# Patient Record
Sex: Male | Born: 1965 | Marital: Married | Smoking: Never smoker
Health system: Southern US, Community
[De-identification: ages and names within clinical notes are randomized; demographics above are authoritative.]

## PROBLEM LIST (undated history)

## (undated) DIAGNOSIS — K8001 Calculus of gallbladder with acute cholecystitis with obstruction: Principal | ICD-10-CM

## (undated) HISTORY — DX: Calculus of gallbladder with acute cholecystitis with obstruction: K80.01

---

## 2013-07-01 ENCOUNTER — Encounter (HOSPITAL_COMMUNITY): Payer: BC Managed Care – PPO | Admitting: Anesthesiology

## 2013-07-01 ENCOUNTER — Encounter (HOSPITAL_COMMUNITY)
Admission: EM | Disposition: A | Discharge status: Home / Self Care | Payer: Self-pay | Source: Home / Self Care | Attending: Emergency Medicine

## 2013-07-01 ENCOUNTER — Observation Stay (HOSPITAL_COMMUNITY): Payer: BC Managed Care – PPO

## 2013-07-01 ENCOUNTER — Observation Stay (HOSPITAL_COMMUNITY)
Admission: EM | Admit: 2013-07-01 | Discharge: 2013-07-02 | Disposition: A | Discharge status: Home / Self Care | Payer: BC Managed Care – PPO | Attending: Surgery | Admitting: Surgery

## 2013-07-01 ENCOUNTER — Emergency Department (HOSPITAL_COMMUNITY): Payer: BC Managed Care – PPO

## 2013-07-01 ENCOUNTER — Encounter (HOSPITAL_COMMUNITY): Payer: Self-pay | Admitting: Emergency Medicine

## 2013-07-01 ENCOUNTER — Observation Stay (HOSPITAL_COMMUNITY): Payer: BC Managed Care – PPO | Admitting: Anesthesiology

## 2013-07-01 DIAGNOSIS — K8000 Calculus of gallbladder with acute cholecystitis without obstruction: Principal | ICD-10-CM | POA: Insufficient documentation

## 2013-07-01 DIAGNOSIS — K819 Cholecystitis, unspecified: Secondary | ICD-10-CM

## 2013-07-01 DIAGNOSIS — K810 Acute cholecystitis: Secondary | ICD-10-CM

## 2013-07-01 DIAGNOSIS — K8020 Calculus of gallbladder without cholecystitis without obstruction: Secondary | ICD-10-CM

## 2013-07-01 DIAGNOSIS — K8001 Calculus of gallbladder with acute cholecystitis with obstruction: Secondary | ICD-10-CM | POA: Diagnosis present

## 2013-07-01 DIAGNOSIS — R112 Nausea with vomiting, unspecified: Secondary | ICD-10-CM

## 2013-07-01 DIAGNOSIS — R1011 Right upper quadrant pain: Secondary | ICD-10-CM

## 2013-07-01 HISTORY — PX: CHOLECYSTECTOMY: SHX55

## 2013-07-01 HISTORY — DX: Calculus of gallbladder with acute cholecystitis with obstruction: K80.01

## 2013-07-01 HISTORY — PX: LAPAROSCOPIC CHOLECYSTECTOMY: SUR755

## 2013-07-01 LAB — CBC WITH DIFFERENTIAL/PLATELET
Eosinophils Relative: 0 % (ref 0–5)
HCT: 44.2 % (ref 39.0–52.0)
Hemoglobin: 15.4 g/dL (ref 13.0–17.0)
Lymphocytes Relative: 8 % — ABNORMAL LOW (ref 12–46)
Lymphs Abs: 0.7 10*3/uL (ref 0.7–4.0)
MCV: 89.8 fL (ref 78.0–100.0)
Monocytes Absolute: 0.2 10*3/uL (ref 0.1–1.0)
Platelets: 163 10*3/uL (ref 150–400)
RBC: 4.92 MIL/uL (ref 4.22–5.81)
RDW: 12.5 % (ref 11.5–15.5)
WBC: 9.2 10*3/uL (ref 4.0–10.5)

## 2013-07-01 LAB — TROPONIN I: Troponin I: <0.3 ng/mL (ref <0.30)

## 2013-07-01 LAB — COMPREHENSIVE METABOLIC PANEL
ALT: 16 U/L (ref 0–53)
Alkaline Phosphatase: 52 U/L (ref 39–117)
CO2: 23 mEq/L (ref 19–32)
Calcium: 9.5 mg/dL (ref 8.4–10.5)
Creatinine, Ser: 0.77 mg/dL (ref 0.50–1.35)
GFR calc Af Amer: >90 mL/min (ref >90)
GFR calc non Af Amer: >90 mL/min (ref >90)
Glucose, Bld: 132 mg/dL — ABNORMAL HIGH (ref 70–99)
Potassium: 3.9 mEq/L (ref 3.5–5.1)
Sodium: 135 mEq/L (ref 135–145)

## 2013-07-01 SURGERY — LAPAROSCOPIC CHOLECYSTECTOMY WITH INTRAOPERATIVE CHOLANGIOGRAM
Anesthesia: General | Site: Abdomen

## 2013-07-01 MED ORDER — SODIUM CHLORIDE 0.9 % IV BOLUS (SEPSIS)
1000.0000 mL | Freq: Once | INTRAVENOUS | Status: AC
  Administered 2013-07-01: 1000 mL via INTRAVENOUS

## 2013-07-01 MED ORDER — HEPARIN SODIUM (PORCINE) 5000 UNIT/ML IJ SOLN
5000.0000 [IU] | Freq: Three times a day (TID) | INTRAMUSCULAR | Status: DC
  Filled 2013-07-01 (×3): qty 1

## 2013-07-01 MED ORDER — ARTIFICIAL TEARS OP OINT
TOPICAL_OINTMENT | OPHTHALMIC | Status: DC | PRN
  Administered 2013-07-01: 1 via OPHTHALMIC

## 2013-07-01 MED ORDER — ONDANSETRON HCL 4 MG/2ML IJ SOLN
INTRAMUSCULAR | Status: DC | PRN
  Administered 2013-07-01: 4 mg via INTRAVENOUS

## 2013-07-01 MED ORDER — PROMETHAZINE HCL 25 MG/ML IJ SOLN: 6.2500 mg | INTRAMUSCULAR | Status: DC | PRN

## 2013-07-01 MED ORDER — KCL IN DEXTROSE-NACL 20-5-0.45 MEQ/L-%-% IV SOLN
INTRAVENOUS | Status: DC
  Administered 2013-07-01: 22:00:00 via INTRAVENOUS
  Filled 2013-07-01 (×5): qty 1000

## 2013-07-01 MED ORDER — ONDANSETRON HCL 4 MG/2ML IJ SOLN
4.0000 mg | Freq: Once | INTRAMUSCULAR | Status: AC
  Administered 2013-07-01: 4 mg via INTRAVENOUS
  Filled 2013-07-01: qty 2

## 2013-07-01 MED ORDER — FENTANYL CITRATE 0.05 MG/ML IJ SOLN
INTRAMUSCULAR | Status: DC | PRN
  Administered 2013-07-01: 50 ug via INTRAVENOUS
  Administered 2013-07-01: 100 ug via INTRAVENOUS

## 2013-07-01 MED ORDER — HYDROMORPHONE HCL PF 1 MG/ML IJ SOLN
2.0000 mg | INTRAMUSCULAR | Status: DC | PRN
  Administered 2013-07-02: 2 mg via INTRAVENOUS
  Filled 2013-07-01: qty 2

## 2013-07-01 MED ORDER — BUPIVACAINE HCL (PF) 0.25 % IJ SOLN
INTRAMUSCULAR | Status: AC
  Filled 2013-07-01: qty 30

## 2013-07-01 MED ORDER — OXYCODONE-ACETAMINOPHEN 5-325 MG PO TABS
1.0000 | ORAL_TABLET | ORAL | Status: DC | PRN
Start: 2013-07-01 — End: 2013-07-02
  Administered 2013-07-02: 2 via ORAL
  Filled 2013-07-01: qty 2

## 2013-07-01 MED ORDER — LIDOCAINE HCL (CARDIAC) 20 MG/ML IV SOLN
INTRAVENOUS | Status: DC | PRN
  Administered 2013-07-01: 100 mg via INTRAVENOUS

## 2013-07-01 MED ORDER — LACTATED RINGERS IV SOLN
INTRAVENOUS | Status: DC | PRN
  Administered 2013-07-01 (×2): via INTRAVENOUS

## 2013-07-01 MED ORDER — CIPROFLOXACIN IN D5W 400 MG/200ML IV SOLN
400.0000 mg | Freq: Two times a day (BID) | INTRAVENOUS | Status: DC
  Administered 2013-07-01 (×2): 400 mg via INTRAVENOUS
  Filled 2013-07-01 (×3): qty 200

## 2013-07-01 MED ORDER — OXYCODONE HCL 5 MG/5ML PO SOLN: 5.0000 mg | Freq: Once | ORAL | Status: DC | PRN

## 2013-07-01 MED ORDER — SODIUM CHLORIDE 0.9 % IR SOLN
Status: DC | PRN
  Administered 2013-07-01: 1000 mL

## 2013-07-01 MED ORDER — HYDROMORPHONE HCL PF 1 MG/ML IJ SOLN
0.2500 mg | INTRAMUSCULAR | Status: DC | PRN
  Administered 2013-07-01 (×2): 0.5 mg via INTRAVENOUS

## 2013-07-01 MED ORDER — HYDROMORPHONE HCL PF 1 MG/ML IJ SOLN
INTRAMUSCULAR | Status: AC
  Filled 2013-07-01: qty 1

## 2013-07-01 MED ORDER — DIPHENHYDRAMINE HCL 50 MG/ML IJ SOLN: 12.5000 mg | Freq: Four times a day (QID) | INTRAMUSCULAR | Status: DC | PRN

## 2013-07-01 MED ORDER — SUCCINYLCHOLINE CHLORIDE 20 MG/ML IJ SOLN
INTRAMUSCULAR | Status: DC | PRN
  Administered 2013-07-01: 100 mg via INTRAVENOUS

## 2013-07-01 MED ORDER — GLYCOPYRROLATE 0.2 MG/ML IJ SOLN
INTRAMUSCULAR | Status: DC | PRN
  Administered 2013-07-01: 0.6 mg via INTRAVENOUS

## 2013-07-01 MED ORDER — ONDANSETRON HCL 4 MG/2ML IJ SOLN: 4.0000 mg | INTRAMUSCULAR | Status: DC | PRN

## 2013-07-01 MED ORDER — MORPHINE SULFATE 4 MG/ML IJ SOLN
4.0000 mg | Freq: Once | INTRAMUSCULAR | Status: AC
  Administered 2013-07-01: 4 mg via INTRAVENOUS
  Filled 2013-07-01: qty 1

## 2013-07-01 MED ORDER — DIPHENHYDRAMINE HCL 12.5 MG/5ML PO ELIX: 12.5000 mg | ORAL_SOLUTION | Freq: Four times a day (QID) | ORAL | Status: DC | PRN

## 2013-07-01 MED ORDER — KCL IN DEXTROSE-NACL 20-5-0.45 MEQ/L-%-% IV SOLN
INTRAVENOUS | Status: DC
  Administered 2013-07-01: 14:00:00 via INTRAVENOUS
  Filled 2013-07-01 (×3): qty 1000

## 2013-07-01 MED ORDER — MIDAZOLAM HCL 5 MG/5ML IJ SOLN
INTRAMUSCULAR | Status: DC | PRN
  Administered 2013-07-01: 2 mg via INTRAVENOUS

## 2013-07-01 MED ORDER — MORPHINE SULFATE 2 MG/ML IJ SOLN: 1.0000 mg | INTRAMUSCULAR | Status: DC | PRN

## 2013-07-01 MED ORDER — OXYCODONE HCL 5 MG PO TABS: 5.0000 mg | ORAL_TABLET | Freq: Once | ORAL | Status: DC | PRN

## 2013-07-01 MED ORDER — BUPIVACAINE HCL (PF) 0.25 % IJ SOLN
INTRAMUSCULAR | Status: DC | PRN
  Administered 2013-07-01: 25 mL

## 2013-07-01 MED ORDER — NEOSTIGMINE METHYLSULFATE 1 MG/ML IJ SOLN
INTRAMUSCULAR | Status: DC | PRN
  Administered 2013-07-01: 4 mg via INTRAVENOUS

## 2013-07-01 MED ORDER — PROPOFOL 10 MG/ML IV BOLUS
INTRAVENOUS | Status: DC | PRN
  Administered 2013-07-01: 200 mg via INTRAVENOUS

## 2013-07-01 MED ORDER — ROCURONIUM BROMIDE 100 MG/10ML IV SOLN
INTRAVENOUS | Status: DC | PRN
  Administered 2013-07-01: 30 mg via INTRAVENOUS

## 2013-07-01 MED ORDER — SODIUM CHLORIDE 0.9 % IV SOLN
INTRAVENOUS | Status: DC | PRN
  Administered 2013-07-01: 17:00:00

## 2013-07-01 SURGICAL SUPPLY — 42 items
ADH SKN CLS APL DERMABOND .7 (GAUZE/BANDAGES/DRESSINGS) ×1
APPLIER CLIP ROT 10 11.4 M/L (STAPLE) ×2
APR CLP MED LRG 11.4X10 (STAPLE) ×1
BAG SPEC RTRVL LRG 6X4 10 (ENDOMECHANICALS) ×1
BLADE SURG ROTATE 9660 (MISCELLANEOUS) IMPLANT
CANISTER SUCTION 2500CC (MISCELLANEOUS) ×2 IMPLANT
CHLORAPREP W/TINT 26ML (MISCELLANEOUS) ×2 IMPLANT
CLIP APPLIE ROT 10 11.4 M/L (STAPLE) ×1 IMPLANT
CLSR STERI-STRIP ANTIMIC 1/2X4 (GAUZE/BANDAGES/DRESSINGS) ×2 IMPLANT
COVER MAYO STAND STRL (DRAPES) ×2 IMPLANT
COVER SURGICAL LIGHT HANDLE (MISCELLANEOUS) ×2 IMPLANT
DECANTER SPIKE VIAL GLASS SM (MISCELLANEOUS) ×2 IMPLANT
DERMABOND ADVANCED (GAUZE/BANDAGES/DRESSINGS) ×1
DERMABOND ADVANCED .7 DNX12 (GAUZE/BANDAGES/DRESSINGS) ×1 IMPLANT
DRAPE C-ARM 42X72 X-RAY (DRAPES) ×2 IMPLANT
DRAPE UTILITY 15X26 W/TAPE STR (DRAPE) ×4 IMPLANT
ELECT REM PT RETURN 9FT ADLT (ELECTROSURGICAL) ×2
ELECTRODE REM PT RTRN 9FT ADLT (ELECTROSURGICAL) ×1 IMPLANT
FILTER SMOKE EVAC LAPAROSHD (FILTER) IMPLANT
GLOVE BIOGEL PI IND STRL 6.5 (GLOVE) ×1 IMPLANT
GLOVE BIOGEL PI INDICATOR 6.5 (GLOVE) ×1
GLOVE EUDERMIC 7 POWDERFREE (GLOVE) ×2 IMPLANT
GLOVE SURG SS PI 6.0 STRL IVOR (GLOVE) ×1 IMPLANT
GOWN STRL NON-REIN LRG LVL3 (GOWN DISPOSABLE) ×2 IMPLANT
GOWN STRL REIN XL XLG (GOWN DISPOSABLE) ×2 IMPLANT
KIT BASIN OR (CUSTOM PROCEDURE TRAY) ×2 IMPLANT
KIT ROOM TURNOVER OR (KITS) ×2 IMPLANT
NS IRRIG 1000ML POUR BTL (IV SOLUTION) ×2 IMPLANT
PAD ARMBOARD 7.5X6 YLW CONV (MISCELLANEOUS) ×2 IMPLANT
POUCH SPECIMEN RETRIEVAL 10MM (ENDOMECHANICALS) ×2 IMPLANT
SCISSORS LAP 5X35 DISP (ENDOMECHANICALS) ×2 IMPLANT
SET CHOLANGIOGRAPH 5 50 .035 (SET/KITS/TRAYS/PACK) ×2 IMPLANT
SET IRRIG TUBING LAPAROSCOPIC (IRRIGATION / IRRIGATOR) ×2 IMPLANT
SLEEVE ENDOPATH XCEL 5M (ENDOMECHANICALS) ×2 IMPLANT
SPECIMEN JAR SMALL (MISCELLANEOUS) ×2 IMPLANT
SUT MNCRL AB 4-0 PS2 18 (SUTURE) ×2 IMPLANT
TOWEL OR 17X24 6PK STRL BLUE (TOWEL DISPOSABLE) ×2 IMPLANT
TOWEL OR 17X26 10 PK STRL BLUE (TOWEL DISPOSABLE) ×2 IMPLANT
TRAY LAPAROSCOPIC (CUSTOM PROCEDURE TRAY) ×2 IMPLANT
TROCAR XCEL BLUNT TIP 100MML (ENDOMECHANICALS) ×2 IMPLANT
TROCAR XCEL NON-BLD 11X100MML (ENDOMECHANICALS) ×2 IMPLANT
TROCAR XCEL NON-BLD 5MMX100MML (ENDOMECHANICALS) ×2 IMPLANT

## 2013-07-01 NOTE — Anesthesia Procedure Notes (Signed)
Procedure Name: Intubation Date/Time: 07/01/2013 5:04 PM Performed by: Angelica Pou Pre-anesthesia Checklist: Patient identified, Patient being monitored, Emergency Drugs available, Timeout performed and Suction available Patient Re-evaluated:Patient Re-evaluated prior to inductionOxygen Delivery Method: Circle system utilized Preoxygenation: Pre-oxygenation with 100% oxygen Intubation Type: IV induction, Rapid sequence and Cricoid Pressure applied Laryngoscope Size: Mac and 3 Grade View: Grade I Tube type: Oral Tube size: 7.5 mm Number of attempts: 1 Airway Equipment and Method: Stylet Placement Confirmation: ETT inserted through vocal cords under direct vision,  breath sounds checked- equal and bilateral and positive ETCO2 Secured at: 22 cm Tube secured with: Tape Dental Injury: Teeth and Oropharynx as per pre-operative assessment  Comments: Smooth IV induction by Dr Krista Blue; easy atraumatic RSI by Pasty Arch CRNA.

## 2013-07-01 NOTE — ED Notes (Addendum)
Reports pain comes on after eating fatty, greasy foods past 3 weeks. Pain lasts approx an hour & would go away but last night has been constant after eating steak. States pain radiates through to back & sometimes up towards shoulder blade

## 2013-07-01 NOTE — ED Notes (Signed)
Patient transported to Ultrasound 

## 2013-07-01 NOTE — Anesthesia Preprocedure Evaluation (Addendum)
Anesthesia Evaluation  Patient identified by MRN, date of birth, ID band Patient awake    Reviewed: Allergy & Precautions, H&P , NPO status , Patient's Chart, lab work & pertinent test results  History of Anesthesia Complications Negative for: history of anesthetic complications  Airway Mallampati: I TM Distance: >3 FB Neck ROM: Full    Dental  (+) Teeth Intact and Dental Advisory Given   Pulmonary neg pulmonary ROS,  breath sounds clear to auscultation        Cardiovascular negative cardio ROS  Rhythm:regular Rate:Normal     Neuro/Psych negative neurological ROS  negative psych ROS   GI/Hepatic Neg liver ROS,   Endo/Other  negative endocrine ROS  Renal/GU negative Renal ROS     Musculoskeletal   Abdominal   Peds  Hematology   Anesthesia Other Findings   Reproductive/Obstetrics negative OB ROS                          Anesthesia Physical Anesthesia Plan  ASA: II and emergent  Anesthesia Plan: General ETT and General   Post-op Pain Management:    Induction: Intravenous  Airway Management Planned: Oral ETT  Additional Equipment:   Intra-op Plan:   Post-operative Plan: Extubation in OR  Informed Consent: I have reviewed the patients History and Physical, chart, labs and discussed the procedure including the risks, benefits and alternatives for the proposed anesthesia with the patient or authorized representative who has indicated his/her understanding and acceptance.   Dental advisory given  Plan Discussed with: Anesthesiologist, CRNA and Surgeon  Anesthesia Plan Comments:        Anesthesia Quick Evaluation

## 2013-07-01 NOTE — Anesthesia Postprocedure Evaluation (Signed)
Anesthesia Post Note  Patient: George Patrick  Procedure(s) Performed: Procedure(s) (LRB): LAPAROSCOPIC CHOLECYSTECTOMY WITH INTRAOPERATIVE CHOLANGIOGRAM (N/A)  Anesthesia type: General  Patient location: PACU  Post pain: Pain level controlled and Adequate analgesia  Post assessment: Post-op Vital signs reviewed, Patient's Cardiovascular Status Stable, Respiratory Function Stable, Patent Airway and Pain level controlled  Last Vitals:  Filed Vitals:   07/01/13 1936  BP: 111/65  Pulse: 58  Temp: 36.4 C  Resp: 15    Post vital signs: Reviewed and stable  Level of consciousness: awake, alert  and oriented  Complications: No apparent anesthesia complications

## 2013-07-01 NOTE — Preoperative (Signed)
Beta Blockers   Reason not to administer Beta Blockers:Not Applicable 

## 2013-07-01 NOTE — H&P (Signed)
George Patrick 09/25/1965  [id number].   Primary Care MD: none Chief Complaint/Reason for Consult: RUQ abdominal pain HPI: This is [name] who for the last 3 weeks has had intermittent RUQ abdominal pain after eating fatty or spicy food.  It would last for 2-3 hours and go away.  Last night around 2000, he developed sharp RUQ abdominal pain with profuse nausea and emesis.  He denies any fevers or chills.  Due to persistent abdominal pain, he presented to the [facility] where he had a work up that revealed gallstones, but no definite gb wall thickening.  He did have a + Murphy's signs though on ultrasound.  His labs are all normal except he has a left shift with a neutrophil count of 90.  We have been asked to evaluate the patient for admission.  ROS: Please seeHPI, otherwise all other systems have been reviewed and are negative  No family history on file.  History reviewed. No pertinent past medical history.  History reviewed. No pertinent past surgical history.  Social History:  reports that he has never smoked. He does not have any smokeless tobacco history on file. He reports that he does not drink alcohol or use illicit drugs.  Allergies: No Known Allergies   (Not in a hospital admission)  Blood pressure 134/69, pulse 55, temperature 99 F (37.2 C), temperature source Oral, resp. rate 16, height 5\' 10"  (1.778 m), weight 170 lb (77.111 kg), SpO2 100.00%. Physical Exam: General: pleasant, WD, WN white male who is laying in bed in NAD HEENT: head is normocephalic, atraumatic.  Sclera are noninjected.  PERRL.  Ears and nose without any masses or lesions.  Mouth is pink and moist Heart: regular, rate, and rhythm.  Normal s1,s2. No obvious murmurs, gallops, or rubs noted.  Palpable radial and pedal pulses bilaterally Lungs: CTAB, no wheezes, rhonchi, or rales noted.  Respiratory effort nonlabored Abd: soft, mild tenderness in RUQ (just received pain meds), ND, +BS, no masses,  hernias, or organomegaly MS: all 4 extremities are symmetrical with no cyanosis, clubbing, or edema. Skin: warm and dry with no masses, lesions, or rashes Psych: A&Ox3 with an appropriate affect.    Results for orders placed during the hospital encounter of 07/01/13 (from the past 48 hour(s))  CBC WITH DIFFERENTIAL     Status: Abnormal   Collection Time    07/01/13  8:30 AM      Result Value Range   WBC 9.2  4.0 - 10.5 K/uL   RBC 4.92  4.22 - 5.81 MIL/uL   Hemoglobin 15.4  13.0 - 17.0 g/dL   HCT 40.9  81.1 - 91.4 %   MCV 89.8  78.0 - 100.0 fL   MCH 31.3  26.0 - 34.0 pg   MCHC 34.8  30.0 - 36.0 g/dL   RDW 78.2  95.6 - 21.3 %   Platelets 163  150 - 400 K/uL   Neutrophils Relative % 90 (*) 43 - 77 %   Neutro Abs 8.2 (*) 1.7 - 7.7 K/uL   Lymphocytes Relative 8 (*) 12 - 46 %   Lymphs Abs 0.7  0.7 - 4.0 K/uL   Monocytes Relative 3  3 - 12 %   Monocytes Absolute 0.2  0.1 - 1.0 K/uL   Eosinophils Relative 0  0 - 5 %   Eosinophils Absolute 0.0  0.0 - 0.7 K/uL   Basophils Relative 0  0 - 1 %   Basophils Absolute 0.0  0.0 -  0.1 K/uL  COMPREHENSIVE METABOLIC PANEL     Status: Abnormal   Collection Time    07/01/13  8:30 AM      Result Value Range   Sodium 135  135 - 145 mEq/L   Potassium 3.9  3.5 - 5.1 mEq/L   Chloride 99  96 - 112 mEq/L   CO2 23  19 - 32 mEq/L   Glucose, Bld 132 (*) 70 - 99 mg/dL   BUN 21  6 - 23 mg/dL   Creatinine, Ser 4.09  0.50 - 1.35 mg/dL   Calcium 9.5  8.4 - 81.1 mg/dL   Total Protein 7.8  6.0 - 8.3 g/dL   Albumin 4.5  3.5 - 5.2 g/dL   AST 18  0 - 37 U/L   ALT 16  0 - 53 U/L   Alkaline Phosphatase 52  39 - 117 U/L   Total Bilirubin 0.5  0.3 - 1.2 mg/dL   GFR calc non Af Amer >90  >90 mL/min   GFR calc Af Amer >90  >90 mL/min   Comment: (NOTE)     The eGFR has been calculated using the CKD EPI equation.     This calculation has not been validated in all clinical situations.     eGFR's persistently <90 mL/min signify possible Chronic Kidney      Disease.  LIPASE, BLOOD     Status: None   Collection Time    07/01/13  8:30 AM      Result Value Range   Lipase 28  11 - 59 U/L  TROPONIN I     Status: None   Collection Time    07/01/13  8:30 AM      Result Value Range   Troponin I <0.30  <0.30 ng/mL   Comment:            Due to the release kinetics of cTnI,     a negative result within the first hours     of the onset of symptoms does not rule out     myocardial infarction with certainty.     If myocardial infarction is still suspected,     repeat the test at appropriate intervals.   US Abdomen Complete  07/01/2013   CLINICAL DATA:  47 year old male with nausea vomiting and right upper quadrant pain. Initial encounter.  EXAM: ULTRASOUND ABDOMEN COMPLETE  COMPARISON:  CT Abdomen and Pelvis 01/14/2006 [facility] radiology.  FINDINGS: Gallbladder:  Positive gallstones, up to 18 mm diameter individually. Gallbladder wall thickness is at 2 mm, but a positive sonographic Eulah Pont sign is elicited. No pericholecystic fluid.  Common bile duct:  Diameter: 6-7 mm, upper limits of normal to mildly increased.  Liver:  No definite intrahepatic ductal dilatation. No focal lesion identified. Within normal limits in parenchymal echogenicity.  IVC:  No abnormality visualized.  Pancreas:  Visualized portion unremarkable.  Spleen:  Size and appearance within normal limits.  Right Kidney:  Length: 11.3 cm. Echogenicity within normal limits. No mass or hydronephrosis visualized.  Left Kidney:  Length: 12.1 cm. Echogenicity within normal limits. No mass or hydronephrosis visualized.  Abdominal aorta:  No aneurysm visualized.  Other findings:  None.  IMPRESSION: 1. Positive for cholelithiasis and positive sonographic Murphy's sign, compatible with acute cholecystitis.  2. CBD at the upper limits of normal to mildly increased. No definite intrahepatic ductal dilatation.   Electronically Signed   By: Augusto Gamble M.D.   On: 07/01/2013 09:42  Assessment/Plan 1.  Cholelithiasis with probably early cholecystitis  Plan: 1. We will get the patient admitted and procedure with surgical intervention today for a cholecystectomy.  He will be started on IV abx therapy as well as multiple meds for pain and nausea.  I have explained this plan to the patient and his family.  They understand and are agreeable to proceed with surgical intervention.  Sharmel Ballantine E 07/01/2013, 11:08 AM Pager: [phone]

## 2013-07-01 NOTE — Transfer of Care (Signed)
Immediate Anesthesia Transfer of Care Note  Patient: George Patrick  Procedure(s) Performed: Procedure(s): LAPAROSCOPIC CHOLECYSTECTOMY WITH INTRAOPERATIVE CHOLANGIOGRAM (N/A)  Patient Location: PACU  Anesthesia Type:General  Level of Consciousness: awake, oriented and patient cooperative  Airway & Oxygen Therapy: Patient Spontanous Breathing and Patient connected to nasal cannula oxygen  Post-op Assessment: Report given to PACU RN, Post -op Vital signs reviewed and stable and Patient moving all extremities  Post vital signs: Reviewed and stable  Complications: No apparent anesthesia complications

## 2013-07-01 NOTE — H&P (Signed)
Agree with A&P of KO,PA. I discussed surgery risks etc and he is ready to proceed.

## 2013-07-01 NOTE — ED Provider Notes (Signed)
Medical screening examination/treatment/procedure(s) were conducted as a shared visit with non-physician practitioner(s) and myself.  I personally evaluated the patient during the encounter.  EKG Interpretation    Date/Time:    Ventricular Rate:    PR Interval:    QRS Duration:   QT Interval:    QTC Calculation:   R Axis:     Text Interpretation:              Acute cholecystitis.  Admit to Gen Surgery.  VSS.  No issue in ER.    Darlys Gales, MD 07/01/13 [phone]

## 2013-07-01 NOTE — ED Provider Notes (Signed)
CSN: [id number]     Arrival date & time 07/01/13  4098 History   First MD Initiated Contact with Patient 07/01/13 0756     Chief Complaint  Patient presents with  . Abdominal Pain   (Consider location/radiation/quality/duration/timing/severity/associated sxs/prior Treatment) The history is provided by the patient and medical records.   This is a 47 year old male with no significant past medical history presenting to the ED for intermittent right upper quadrant pain for the past 3 weeks. Pain has been constant since 2000 last night, described as a throbbing pain with intermittent sharp, stabbing sensations with some radiation to his back.  States there is occasional radiation to the right side of his chest, none in the past 24 hours.  Pain associated with nausea and approximately 12 episodes of nonbloody, nonbilious emesis. Last emesis at 0400 this morning. Last PO intake was a steak last night.  No prior abdominal surgeries.  Not on any daily medications.  Denies any current chest pain, SOB, palpitations, dizziness, weakness.  No past medical history on file. No past surgical history on file. No family history on file. History  Substance Use Topics  . Smoking status: Not on file  . Smokeless tobacco: Not on file  . Alcohol Use: Not on file    Review of Systems  Gastrointestinal: Positive for nausea, vomiting and abdominal pain.  All other systems reviewed and are negative.    Allergies  Review of patient's allergies indicates not on file.  Home Medications  No current outpatient prescriptions on file. BP 141/76  Pulse 65  Temp(Src) 97.9 F (36.6 C) (Oral)  Resp 16  Ht 5\' 10"  (1.778 m)  Wt 170 lb (77.111 kg)  BMI 24.39 kg/m2  SpO2 100%  Physical Exam  Nursing note and vitals reviewed. Constitutional: He is oriented to person, place, and time. He appears well-developed and well-nourished.  HENT:  Head: Normocephalic and atraumatic.  Mouth/Throat: Oropharynx is clear and  moist.  Eyes: Conjunctivae and EOM are normal. Pupils are equal, round, and reactive to light.  Neck: Normal range of motion.  Cardiovascular: Normal rate, regular rhythm and normal heart sounds.   Pulmonary/Chest: Effort normal and breath sounds normal. No respiratory distress. He has no wheezes.  Abdominal: Soft. Bowel sounds are normal. There is tenderness in the right upper quadrant. There is positive Murphy's sign. There is no rigidity, no guarding, no CVA tenderness and no tenderness at McBurney's point.  Abdomen soft, non-distended, TTP RUQ, + Murphy's  Musculoskeletal: Normal range of motion.  Neurological: He is alert and oriented to person, place, and time.  Skin: Skin is warm and dry.  Psychiatric: He has a normal mood and affect.    ED Course  Procedures (including critical care time) Labs Review Labs Reviewed  CBC WITH DIFFERENTIAL - Abnormal; Notable for the following:    Neutrophils Relative % 90 (*)    Neutro Abs 8.2 (*)    Lymphocytes Relative 8 (*)    All other components within normal limits  COMPREHENSIVE METABOLIC PANEL - Abnormal; Notable for the following:    Glucose, Bld 132 (*)    All other components within normal limits  LIPASE, BLOOD  TROPONIN I   Imaging Review US Abdomen Complete  07/01/2013   CLINICAL DATA:  47 year old male with nausea vomiting and right upper quadrant pain. Initial encounter.  EXAM: ULTRASOUND ABDOMEN COMPLETE  COMPARISON:  CT Abdomen and Pelvis 01/14/2006 [facility] radiology.  FINDINGS: Gallbladder:  Positive gallstones, up to 18 mm diameter individually.  Gallbladder wall thickness is at 2 mm, but a positive sonographic Eulah Pont sign is elicited. No pericholecystic fluid.  Common bile duct:  Diameter: 6-7 mm, upper limits of normal to mildly increased.  Liver:  No definite intrahepatic ductal dilatation. No focal lesion identified. Within normal limits in parenchymal echogenicity.  IVC:  No abnormality visualized.  Pancreas:   Visualized portion unremarkable.  Spleen:  Size and appearance within normal limits.  Right Kidney:  Length: 11.3 cm. Echogenicity within normal limits. No mass or hydronephrosis visualized.  Left Kidney:  Length: 12.1 cm. Echogenicity within normal limits. No mass or hydronephrosis visualized.  Abdominal aorta:  No aneurysm visualized.  Other findings:  None.  IMPRESSION: 1. Positive for cholelithiasis and positive sonographic Murphy's sign, compatible with acute cholecystitis.  2. CBD at the upper limits of normal to mildly increased. No definite intrahepatic ductal dilatation.   Electronically Signed   By: Augusto Gamble M.D.   On: 07/01/2013 09:42    EKG Interpretation   None       MDM   1. Acute cholecystitis   2. Nausea & vomiting     Labs as above, LFT's and bili WNL, no leukocytosis.  U/s consistent with acute cholecystitis.  Consulted general surgery, spoke with Tresa Endo (surgical PA) will see pt in the ED and admit.  Likely surgery this afternoon.  VS stable at this time.   Garlon Hatchet, PA-C 07/01/13 1150

## 2013-07-01 NOTE — ED Notes (Signed)
Jettie Pagan, PA with CCS at bedside.

## 2013-07-01 NOTE — ED Notes (Signed)
C/o intermittent RUQ pain x 3 weeks, pain has been constant since 2000 last night with n/v

## 2013-07-01 NOTE — Op Note (Signed)
Almalik L Mau 30-Jul-1965 [id number] 07/01/2013  Preoperative diagnosis: Acute calculus cholecystitis  Postoperative diagnosis: Same  Procedure: Laparoscopic cholecystectomy and intraoperative plan TRAM  Surgeon: Currie Paris, MD, FACS    Anesthesia: General  Clinical History and Indications: The patient presented to the emergency room today with acute right upper quadrant pain and was found to have right upper quadrant tenderness and a tender gallbladder with stones. After discussion he elected to proceed today to cholecystectomy.Marland Kitchen  Description of procedure: The patient was seen in the preoperative area. I reviewed the plans for the procedure with him as well as the risks and complications. He had no further questions and wished to proceed.  The patient was taken to the operating room. After satisfactory general endotracheal anesthesia had been obtained the abdomen was prepped and draped. A time out was done.  0.25% plain Marcaine was used at all incisions. I made an umbilical incision, identified the fascia and opened that, and entered the peritoneal cavity under direct vision. A 0 Vicryl pursestring suture was placed and the Hasson cannula was introduced under direct vision and secured with the pursestring. The abdomen was inflated to 15 cm.  The camera was placed and there were no gross abnormalities. The patient was then placed in reverse Trendelenburg and tilted to the left. A 10/11 trocar was placed in the epigastrium and two 5 mm trochars placed laterally all under direct vision.  The gallbladder was tense distended and inflamed and edematous. It was decompressed a large needle. I was able to grasp and retracted over the liver an open edematous peritoneum around the cystic duct. There is a small artery anterior to the duct which was clipped. A long segment of the duct was isolated. A more posterior branch of the artery was also noted. A clip was placed on the duct.  An  intraoperative cholangiogram was then performed. A Cook catheter was introduced percutaneously and placed in the cystic duct. The cholangiogram showed good filling of the common duct and hepatic radicals and free flow into the duodenum. No abnormalities were noted.  The catheter was removed and 3 clips placed on the stay side of the cystic duct. The duct was then divided.  Additional clips are placed on the cystic artery and it was divided. The gallbladder was then removed from below to above the coagulation current of the cautery. It was then placed in a bag to be retrieved later.  The abdomen was irrigated and a check for hemostasis along the bed of the gallbladder made. Once everything appeared to be dry we were able to move the camera to the epigastric port and removed the gallbladder through the umbilical port.  The abdomen was reinsufflated and a final check for hemostasis made. There is no evidence of bleeding or bile leakage. The lateral ports were removed under direct vision and there was no bleeding. The umbilical site was closed with a pursestring, watching with the camera in the epigastric port. The abdomen was then deflated through the epigastric port and that was removed. Skin was closed with 4-0 Monocryl subcuticular and Dermabond.  The patient tolerated the procedure well. There were no operative complications. EBL was minimal. All counts were correct.  Currie Paris, MD, FACS 07/01/2013 6:13 PM

## 2013-07-02 MED ORDER — OXYCODONE-ACETAMINOPHEN 5-325 MG PO TABS: 1.0000 | ORAL_TABLET | ORAL | Status: DC | PRN

## 2013-07-02 MED ORDER — WHITE PETROLATUM GEL
Status: AC
  Administered 2013-07-02: 11:00:00
  Filled 2013-07-02: qty 5

## 2013-07-02 NOTE — Progress Notes (Signed)
1 Day Post-Op  Subjective: Tolerating diet.  Feels better than preop. Pain controlled  Objective: Vital signs in last 24 hours: Temp:  [97.3 F (36.3 C)-98.2 F (36.8 C)] 98.2 F (36.8 C) (12/06 0954) Pulse Rate:  [51-73] 61 (12/06 0954) Resp:  [10-22] 16 (12/06 0954) BP: (102-146)/(44-76) 138/76 mmHg (12/06 0954) SpO2:  [97 %-100 %] 98 % (12/06 0954) Last BM Date: 06/30/13  Intake/Output from previous day: 12/05 0701 - 12/06 0700 In: 3656.7 [P.O.:220; I.V.:3036.7; IV Piggyback:400] Out: -  Intake/Output this shift:    General appearance: alert, cooperative and no distress Resp: clear to auscultation bilaterally Cardio: normal rate GI: soft, appropriate incisional tenderness, ND, wounds okay  Lab Results:   Recent Labs  07/01/13 0830  WBC 9.2  HGB 15.4  HCT 44.2  PLT 163   BMET  Recent Labs  07/01/13 0830  NA 135  K 3.9  CL 99  CO2 23  GLUCOSE 132*  BUN 21  CREATININE 0.77  CALCIUM 9.5   PT/INR No results found for this basename: LABPROT, INR,  in the last 72 hours ABG No results found for this basename: PHART, PCO2, PO2, HCO3,  in the last 72 hours  Studies/Results: Dg Cholangiogram Operative  07/01/2013   CLINICAL DATA:  Cholecystectomy for cholecystitis and cholelithiasis.  EXAM: INTRAOPERATIVE CHOLANGIOGRAM  TECHNIQUE: Cholangiographic images from the C-arm fluoroscopic device were submitted for interpretation post-operatively. Please see the procedural report for the amount of contrast and the fluoroscopy time utilized.  COMPARISON:  Abdomen ultrasound obtained earlier today.  FINDINGS: Normal-appearing intrahepatic and extrahepatic bile ducts and cystic duct remnant. No intraductal filling defects. Free flow of contrast into the duodenum. Cholecystectomy clips.  IMPRESSION: Normal examination.  No retained ductal stones seen.   Electronically Signed   By: Gordan Payment M.D.   On: 07/01/2013 20:04   US Abdomen Complete  07/01/2013   CLINICAL DATA:   47 year old male with nausea vomiting and right upper quadrant pain. Initial encounter.  EXAM: ULTRASOUND ABDOMEN COMPLETE  COMPARISON:  CT Abdomen and Pelvis 01/14/2006 [facility] radiology.  FINDINGS: Gallbladder:  Positive gallstones, up to 18 mm diameter individually. Gallbladder wall thickness is at 2 mm, but a positive sonographic Eulah Pont sign is elicited. No pericholecystic fluid.  Common bile duct:  Diameter: 6-7 mm, upper limits of normal to mildly increased.  Liver:  No definite intrahepatic ductal dilatation. No focal lesion identified. Within normal limits in parenchymal echogenicity.  IVC:  No abnormality visualized.  Pancreas:  Visualized portion unremarkable.  Spleen:  Size and appearance within normal limits.  Right Kidney:  Length: 11.3 cm. Echogenicity within normal limits. No mass or hydronephrosis visualized.  Left Kidney:  Length: 12.1 cm. Echogenicity within normal limits. No mass or hydronephrosis visualized.  Abdominal aorta:  No aneurysm visualized.  Other findings:  None.  IMPRESSION: 1. Positive for cholelithiasis and positive sonographic Murphy's sign, compatible with acute cholecystitis.  2. CBD at the upper limits of normal to mildly increased. No definite intrahepatic ductal dilatation.   Electronically Signed   By: Augusto Gamble M.D.   On: 07/01/2013 09:42    Anti-infectives: Anti-infectives   Start     Dose/Rate Route Frequency Ordered Stop   07/01/13 1130  ciprofloxacin (CIPRO) IVPB 400 mg     400 mg 200 mL/hr over 60 Minutes Intravenous Every 12 hours 07/01/13 1120        Assessment/Plan: s/p Procedure(s): LAPAROSCOPIC CHOLECYSTECTOMY WITH INTRAOPERATIVE CHOLANGIOGRAM (N/A) He looks good.  tolerated breakfast.  feels better  than preop.  He should be okay for discharge to home.  discharge instructions reviewed with the patient  LOS: 1 day    Lodema Pilot DAVID 07/02/2013

## 2013-07-02 NOTE — Progress Notes (Signed)
Utilization Review completed.  

## 2013-07-02 NOTE — Progress Notes (Signed)
Discharge instructions gone over with patient. presciuption given. Home medications gone over. Incisional care, diet, activity, and signs and symptoms of infection gone over. Patient verbalized understanding of instructions. Follow up appointment to be made.

## 2013-07-05 ENCOUNTER — Encounter (HOSPITAL_COMMUNITY): Payer: Self-pay | Admitting: Surgery

## 2013-07-15 ENCOUNTER — Encounter (INDEPENDENT_AMBULATORY_CARE_PROVIDER_SITE_OTHER): Payer: Self-pay | Admitting: Surgery

## 2013-07-15 ENCOUNTER — Ambulatory Visit (INDEPENDENT_AMBULATORY_CARE_PROVIDER_SITE_OTHER): Payer: PRIVATE HEALTH INSURANCE | Admitting: Surgery

## 2013-07-15 VITALS — BP 110/70 | HR 60 | Temp 98.7°F | Resp 14 | Ht 70.0 in | Wt 169.2 lb

## 2013-07-15 DIAGNOSIS — K8001 Calculus of gallbladder with acute cholecystitis with obstruction: Secondary | ICD-10-CM

## 2013-07-15 NOTE — Progress Notes (Signed)
NAME: George Patrick       DOB: 26-Apr-1966           DATE: 07/15/2013       ZOX:[id number]   CC:  Chief Complaint  Patient presents with  . Routine Post Op    1st p/o lap chole     Impression:  The patient appears to be doing well, with improvement in his symptoms.  Plan:  He may resume full activity and regular diet. He  will followup with [place] on a p.r.n. basis. I did tell him that he may still have some foods that cause indigestion and ask him to call us if there are any questions, problems or concerns.  HPI:  This patient underwent a laparoscopic cholecystectomy with operative cholangiogram on 07/01/13. He is in for his first postoperative visit. He notes that his incisional pain has resolved. His preoperative symptoms have improved. He is not having problems with nausea, vomiting, diarrhea, fevers, chills, or urinary symptoms. He is tolerating diet. He feels that he is progressing well and nearly back to normal. PE:  VS: BP 110/70  Pulse 60  Temp(Src) 98.7 F (37.1 C) (Temporal)  Resp 14  Ht 5\' 10"  (1.778 m)  Wt 169 lb 3.2 oz (76.749 kg)  BMI 24.28 kg/m2  General: The patient is alert and appears comfortable, NAD.  Abdomen: Soft and benign. The incisions are healing nicely. There are no apparent problems.  Data reviewed: IOC:  FINDINGS:  Normal-appearing intrahepatic and extrahepatic bile ducts and cystic  duct remnant. No intraductal filling defects. Free flow of contrast  into the duodenum. Cholecystectomy clips.  IMPRESSION:  Normal examination. No retained ductal stones seen.  Electronically Signed  By: Gordan Payment M.D.  On: 07/01/2013 20:04  Pathology:  Diagnosis Gallbladder - ACUTE AND CHRONIC CHOLECYSTITIS. - CHOLELITHIASIS. [name] MD Pathologist, Electronic Signature (Case signed 07/04/2013)

## 2013-07-15 NOTE — Patient Instructions (Signed)
We will see you again on an as needed basis. Please call the office at [phone] if you have any questions or concerns. Thank you for allowing us to take care of you.  

## 2013-07-27 NOTE — Discharge Summary (Signed)
Patient ID: EMRE STOCK MRN: [id number] DOB/AGE: [birth year] 47 y.o.  Admit date: 07/01/2013 Discharge date: 07/27/2013  Procedures: lap chole with IOC  Consults: None  Reason for Admission: This is [name] who for the last 3 weeks has had intermittent RUQ abdominal pain after eating fatty or spicy food. It would last for 2-3 hours and go away. Last night around 2000, he developed sharp RUQ abdominal pain with profuse nausea and emesis. He denies any fevers or chills. Due to persistent abdominal pain, he presented to the [facility] where he had a work up that revealed gallstones, but no definite gb wall thickening. He did have a + Murphy's signs though on ultrasound. His labs are all normal except he has a left shift with a neutrophil count of 90. We have been asked to evaluate the patient for admission.  Admission Diagnoses:  1. Cholelithiasis with probably early cholecystitis  Hospital Course:  The patient was admitted and taken to the OR where he underwent a lap chole.  He tolerated this well and by POD1 was taking a regular diet and his pain was well controlled.  He was stable for dc home.  Discharge Diagnoses:  1. Acute cholecystitis, s/p lap chole  Discharge Medications:   Medication List    STOP taking these medications       ibuprofen 200 MG tablet  Commonly known as:  ADVIL,MOTRIN        Discharge Instructions: f/u with Dr. Jamey Ripa  Signed: Barnetta Chapel E 07/27/2013, 11:48 AM

## 2013-08-16 ENCOUNTER — Encounter (HOSPITAL_COMMUNITY): Payer: Self-pay | Admitting: Surgery

## 2013-08-16 NOTE — OR Nursing (Signed)
LATE ENTRY: Procedure end time entered 

## 2014-10-14 IMAGING — RF DG CHOLANGIOGRAM OPERATIVE
1 series · 4 of 4 positions shown · non-contrast
Comparison: Abdomen ultrasound obtained earlier today.

CLINICAL DATA: Cholecystectomy for cholecystitis and
cholelithiasis.

EXAM:
INTRAOPERATIVE CHOLANGIOGRAM
TECHNIQUE: Cholangiographic images from the C-arm fluoroscopic device were
submitted for interpretation post-operatively. Please see the
procedural report for the amount of contrast and the fluoroscopy
time utilized.

[Series 1: run · 4 of 91 frames shown]
[frame 1/91]
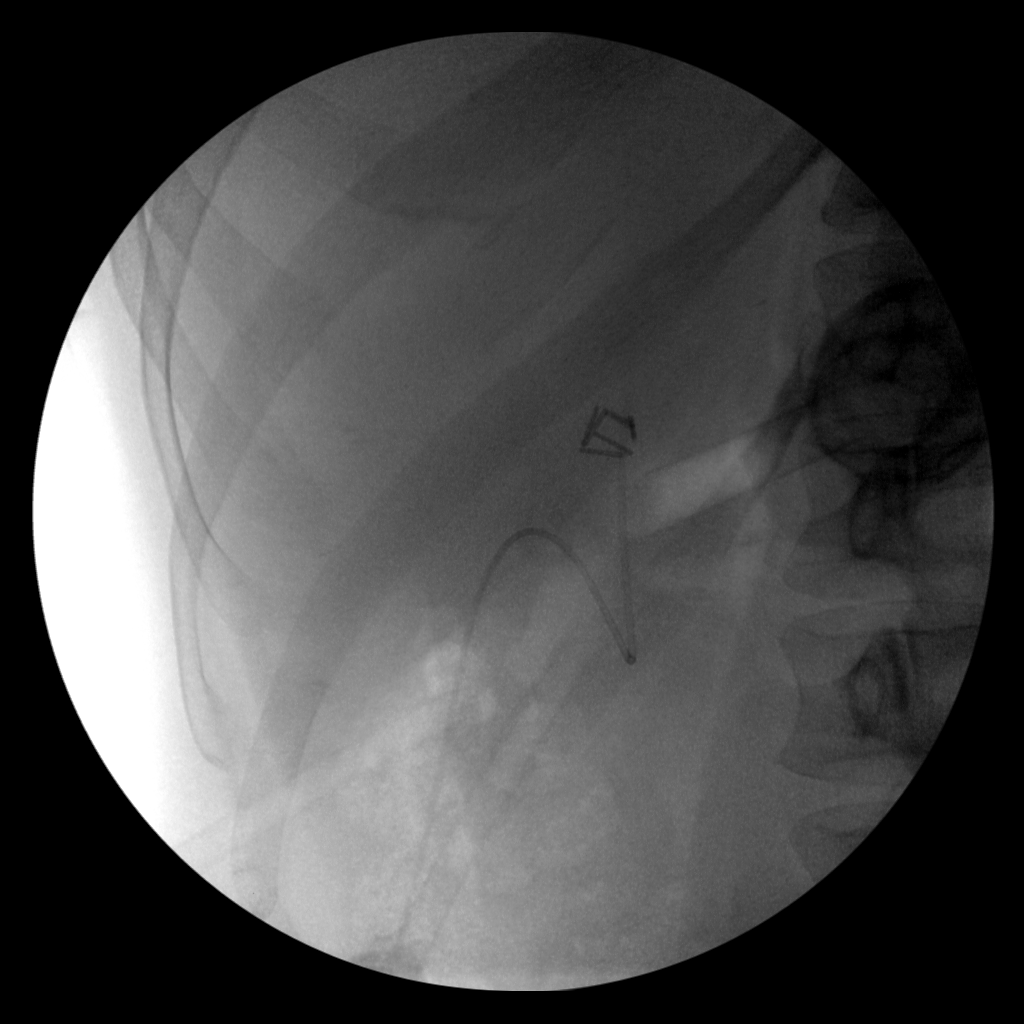
[frame 14/91]
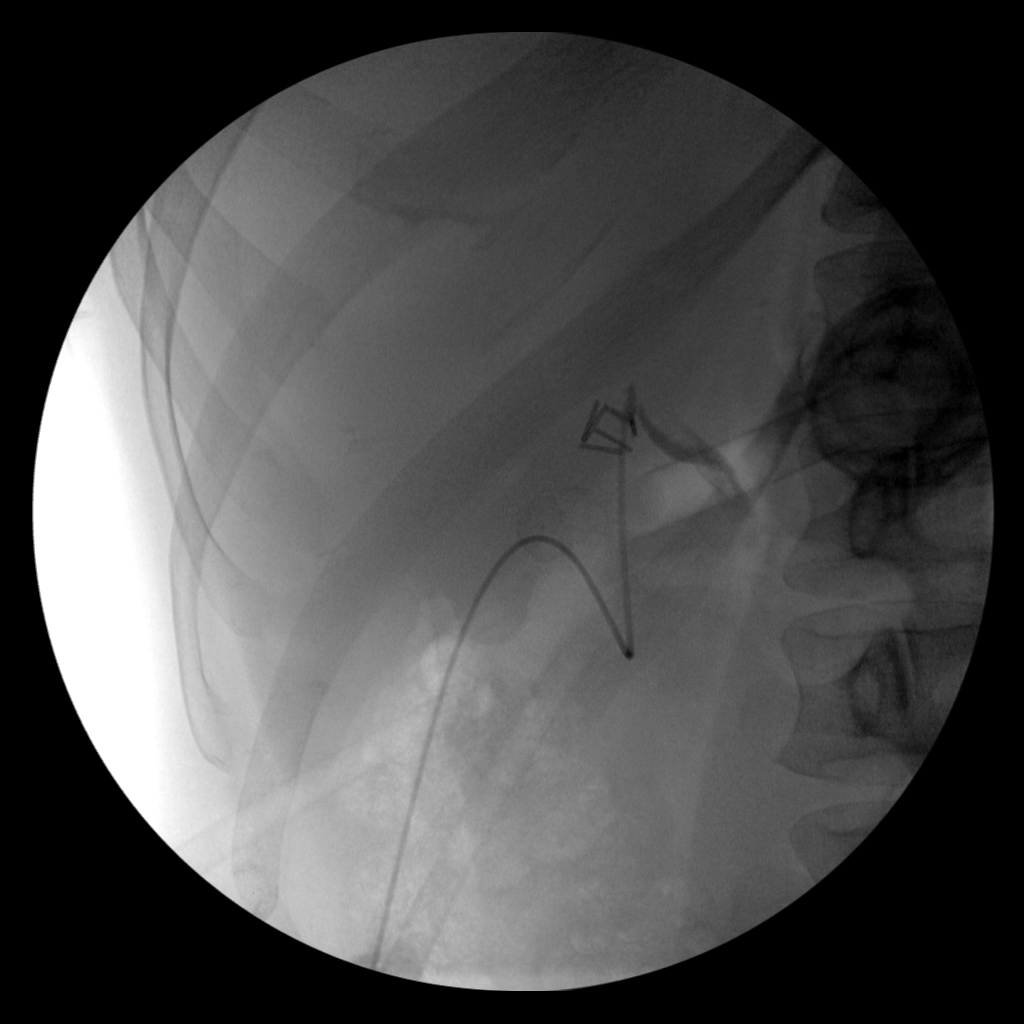
[frame 46/91]
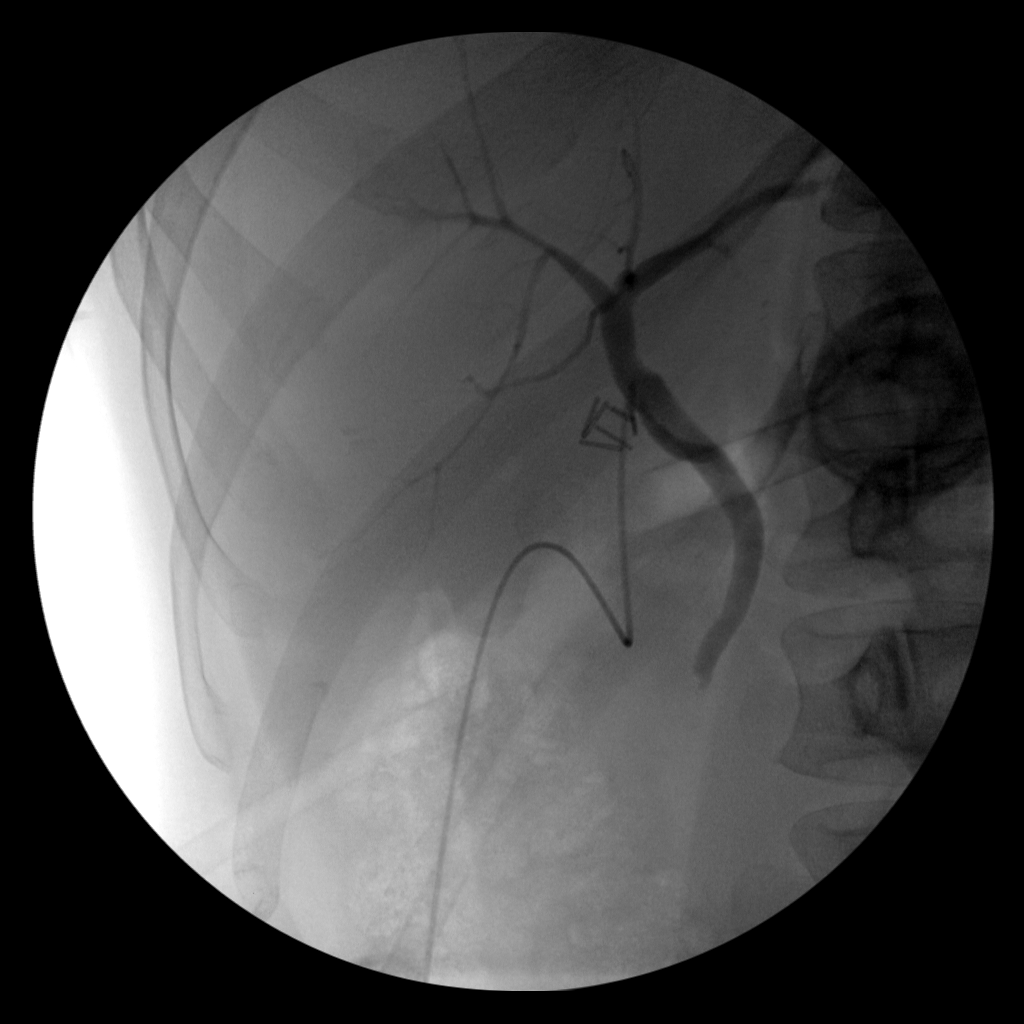
[frame 78/91]
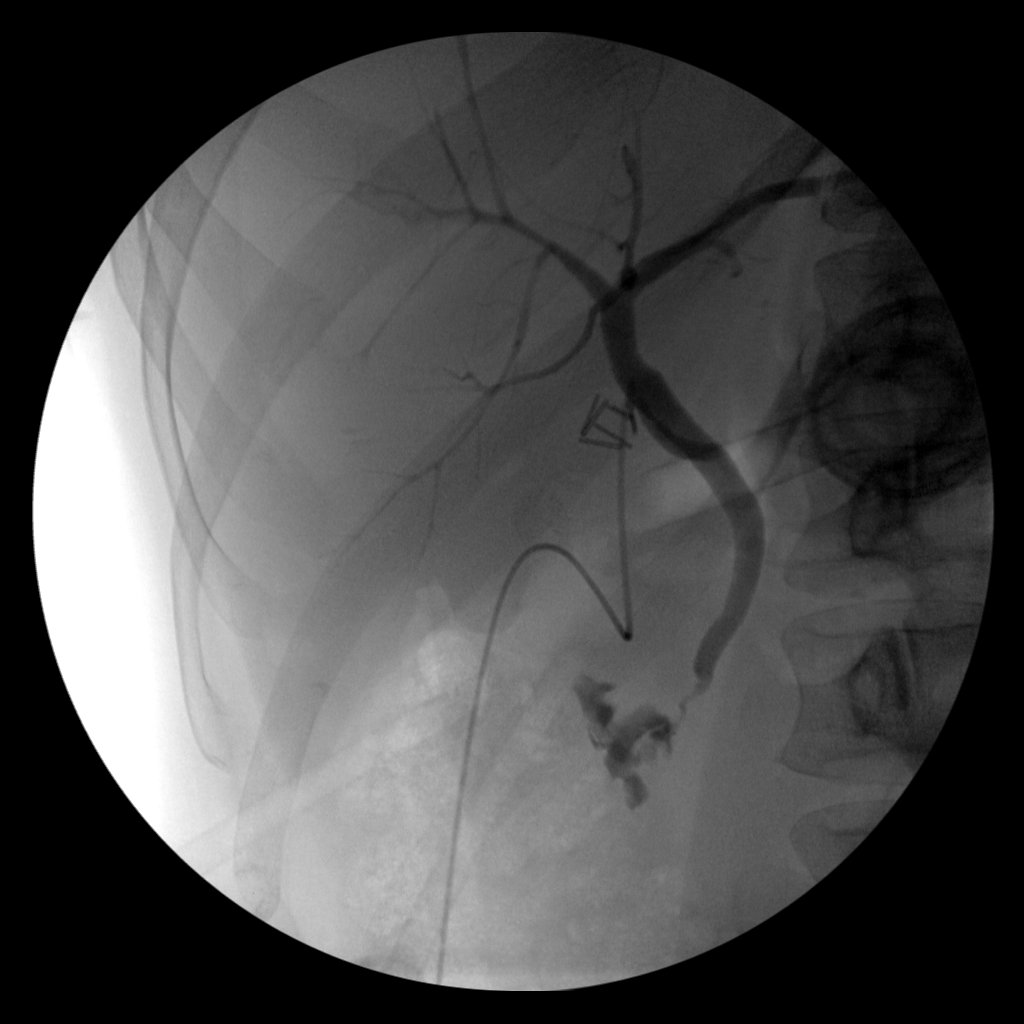

[4 of 4 positions shown; findings below may reference images not displayed]

FINDINGS: Normal-appearing intrahepatic and extrahepatic bile ducts and cystic
duct remnant. No intraductal filling defects. Free flow of contrast
into the duodenum. Cholecystectomy clips.
IMPRESSION: Normal examination.  No retained ductal stones seen.

## 2024-01-14 ENCOUNTER — Other Ambulatory Visit (HOSPITAL_BASED_OUTPATIENT_CLINIC_OR_DEPARTMENT_OTHER): Payer: Self-pay | Admitting: Family Medicine

## 2024-01-14 DIAGNOSIS — E785 Hyperlipidemia, unspecified: Secondary | ICD-10-CM
